# Patient Record
Sex: Male | Born: 1990 | Race: White | Hispanic: No | Marital: Single | Smoking: Never smoker
Health system: Southern US, Community
[De-identification: ages and names within clinical notes are randomized; demographics above are authoritative.]

## PROBLEM LIST (undated history)

## (undated) ENCOUNTER — Emergency Department (HOSPITAL_COMMUNITY): Discharge status: Absent without leave | Payer: 59

## (undated) DIAGNOSIS — L709 Acne, unspecified: Secondary | ICD-10-CM

## (undated) HISTORY — PX: TONSILLECTOMY AND ADENOIDECTOMY: SUR1326

## (undated) HISTORY — DX: Acne, unspecified: L70.9

---
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
## 2009-06-04 ENCOUNTER — Ambulatory Visit: Payer: Self-pay | Admitting: Family Medicine

## 2009-06-04 DIAGNOSIS — L708 Other acne: Secondary | ICD-10-CM | POA: Insufficient documentation

## 2009-07-08 ENCOUNTER — Ambulatory Visit: Payer: Self-pay | Admitting: Family Medicine

## 2010-02-24 ENCOUNTER — Encounter (INDEPENDENT_AMBULATORY_CARE_PROVIDER_SITE_OTHER): Payer: Self-pay | Admitting: *Deleted

## 2010-08-24 NOTE — Letter (Signed)
Summary: Nadara Eaton letter  [place] [facility]  [address] [place], [place] [address]   Phone: [phone]  Fax: [phone]       02/24/2010 MRN: [id number]  AUBREY VOONG [address] [place], [place]  [address]  Dear Mr. HART,  [facility] [place], and [facility] announce the retirement of Arta Silence, M.D., from full-time practice at the [facility] office effective January 21, 2010 and his plans of returning part-time.  It is important to Dr. Hetty Ely and to our practice that you understand that [facility] has seven physicians in our office for your health care needs.  We will continue to offer the same exceptional care that you have today.    Dr. Hetty Ely has spoken to many of you about his plans for retirement and returning part-time in the fall.   We will continue to work with you through the transition to schedule appointments for you in the office and meet the high standards that [facility] is committed to.   Again, it is with great pleasure that we share the news that Dr. Hetty Ely will return to [facility] at [facility] in October of 2011 with a reduced schedule.    If you have any questions, or would like to request an appointment with one of our physicians, please call us at [phone] and press the option for Scheduling an appointment.  We take pleasure in providing you with excellent patient care and look forward to seeing you at your next office visit.  Our [facility] Physicians are:  Tillman Abide, M.D. Laurita Quint, M.D. Roxy Manns, M.D. Kerby Nora, M.D. Hannah Beat, M.D. Ruthe Mannan, M.D. We proudly welcomed Raechel Ache, M.D. and Eustaquio Boyden, M.D. to the practice in July/August 2011.  Sincerely,  [facility]

## 2011-02-10 ENCOUNTER — Encounter: Payer: Self-pay | Admitting: Family Medicine

## 2011-02-11 ENCOUNTER — Encounter: Payer: Self-pay | Admitting: Family Medicine

## 2011-02-11 ENCOUNTER — Ambulatory Visit (INDEPENDENT_AMBULATORY_CARE_PROVIDER_SITE_OTHER): Payer: 59 | Admitting: Family Medicine

## 2011-02-11 VITALS — BP 120/82 | HR 68 | Temp 98.5°F | Wt 174.0 lb

## 2011-02-11 DIAGNOSIS — L708 Other acne: Secondary | ICD-10-CM

## 2011-02-11 DIAGNOSIS — Z0000 Encounter for general adult medical examination without abnormal findings: Secondary | ICD-10-CM | POA: Insufficient documentation

## 2011-02-11 MED ORDER — CLINDAMYCIN PHOSPHATE 1 % EX GEL: CUTANEOUS | Status: DC

## 2011-02-11 NOTE — Patient Instructions (Signed)
Call your insurance and check on getting the meningitis, TDaP (tetanus), and hepatitis B vaccines.  The hep B is a series of 3 shots.   I would get a flu shot each fall.   Keep exercising.  Take care.   Good luck with the police academy.

## 2011-02-13 NOTE — Assessment & Plan Note (Signed)
Restart topicals and f/u prn.

## 2011-02-13 NOTE — Assessment & Plan Note (Signed)
Routine vaccines d/w pt.  He'll check on coverage with insurance.  Healthy habits d/w pt.  Healthy appearing, okay to proceed with [employer] fitness test- no items by hx/PE that would prevent patient from participating.

## 2011-02-13 NOTE — Progress Notes (Signed)
CPE- See plan.  Routine anticipatory guidance given to patient.  See health maintenance.  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  No complaints.  Feeling well.   ROS:  See HPI.  Otherwise negative.  No personal or family history of any disorder that would prevent athletic participation.  PMH and SH reviewed  Meds, vitals, and allergies reviewed.   ROS: See HPI.  Otherwise negative.    GEN: nad, alert and oriented HEENT: mucous membranes moist NECK: supple w/o LA CV: rrr. PULM: ctab, no inc wob ABD: soft, +bs EXT: no edema SKIN: no acute rash but acne noted on face

## 2011-02-28 ENCOUNTER — Ambulatory Visit: Payer: 59 | Admitting: Family Medicine

## 2011-10-10 ENCOUNTER — Encounter: Payer: Self-pay | Admitting: Family Medicine

## 2011-10-10 ENCOUNTER — Ambulatory Visit (INDEPENDENT_AMBULATORY_CARE_PROVIDER_SITE_OTHER): Payer: 59 | Admitting: Family Medicine

## 2011-10-10 VITALS — BP 104/60 | HR 64 | Temp 98.3°F | Wt 189.0 lb

## 2011-10-10 DIAGNOSIS — J069 Acute upper respiratory infection, unspecified: Secondary | ICD-10-CM

## 2011-10-10 DIAGNOSIS — J029 Acute pharyngitis, unspecified: Secondary | ICD-10-CM

## 2011-10-10 LAB — POCT RAPID STREP A (OFFICE): Rapid Strep A Screen: NEGATIVE

## 2011-10-10 NOTE — Progress Notes (Signed)
Duration of symptoms: 3-4 days Rhinorrhea: yes congestion:yes ear pain:no sore throat: yes Cough: mild, minimal sputum Myalgias: no other concerns: + fever, chills/sweats at night +sick contacts.   Pain in frontal area.  ROS: See HPI.  Otherwise negative.    Meds, vitals, and allergies reviewed.   GEN: nad, alert and oriented HEENT: mucous membranes moist, TM w/o erythema, nasal epithelium injected, OP with cobblestoning, sinuses not ttp x4 NECK: supple w/o LA CV: rrr. PULM: ctab, no inc wob ABD: soft, +bs EXT: no edema  RST neg

## 2011-10-10 NOTE — Patient Instructions (Signed)
Drink plenty of fluids, take tylenol as needed, and gargle with warm salt water for your throat.  This should gradually improve.  Take care.  Let us know if you have other concerns.    

## 2011-10-11 DIAGNOSIS — J069 Acute upper respiratory infection, unspecified: Secondary | ICD-10-CM | POA: Insufficient documentation

## 2011-10-11 NOTE — Assessment & Plan Note (Signed)
Likely viral, nontoxic, supportive tx and f/u prn. Low chance of benefit with abx.  D/w pt.

## 2011-11-30 ENCOUNTER — Encounter: Payer: Self-pay | Admitting: Family Medicine

## 2011-11-30 ENCOUNTER — Ambulatory Visit (INDEPENDENT_AMBULATORY_CARE_PROVIDER_SITE_OTHER)
Admission: RE | Admit: 2011-11-30 | Discharge: 2011-11-30 | Disposition: A | Discharge status: Home / Self Care | Payer: 59 | Source: Ambulatory Visit | Attending: Family Medicine | Admitting: Family Medicine

## 2011-11-30 ENCOUNTER — Ambulatory Visit (INDEPENDENT_AMBULATORY_CARE_PROVIDER_SITE_OTHER): Payer: 59 | Admitting: Family Medicine

## 2011-11-30 VITALS — BP 120/72 | HR 69 | Temp 98.5°F | Ht 70.0 in | Wt 186.1 lb

## 2011-11-30 DIAGNOSIS — M25569 Pain in unspecified knee: Secondary | ICD-10-CM

## 2011-11-30 DIAGNOSIS — M25561 Pain in right knee: Secondary | ICD-10-CM

## 2011-11-30 LAB — CBC WITH DIFFERENTIAL/PLATELET
Basophils Absolute: 0 10*3/uL (ref 0.0–0.1)
Eosinophils Absolute: 0.1 10*3/uL (ref 0.0–0.7)
Lymphocytes Relative: 33.2 % (ref 12.0–46.0)
MCHC: 34.3 g/dL (ref 30.0–36.0)
Neutrophils Relative %: 55.1 % (ref 43.0–77.0)
Platelets: 198 10*3/uL (ref 150.0–400.0)
RBC: 4.81 Mil/uL (ref 4.22–5.81)
RDW: 13.1 % (ref 11.5–14.6)

## 2011-11-30 MED ORDER — DICLOFENAC SODIUM 75 MG PO TBEC: 75.0000 mg | DELAYED_RELEASE_TABLET | Freq: Two times a day (BID) | ORAL | Status: AC

## 2011-11-30 NOTE — Patient Instructions (Signed)
Recheck Dr. Patsy Lager 3-4 weeks

## 2011-11-30 NOTE — Progress Notes (Signed)
  Patient Name: Barry Heath Date of Birth: Oct 21, 1990 Age: 21 y.o. Medical Record Number: [id number] Gender: male Date of Encounter: 11/30/2011  History of Present Illness:  Barry Heath is a 22 y.o. very pleasant male patient who presents with the following:  R knee, monday nothing recently. Mostly lateraly and posterior  Relatively acute onset of knee pain and limping starting on Monday without any instigating event. No trauma or injury. 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 He also just started doing the insanity workout, but he just started that last night per his report.  No symptomatic giving way. No locking up. He does have a significant restriction in his flexion right now   Past Medical History, Surgical History, Social History, Family History, Problem List, Medications, and Allergies have been reviewed and updated if relevant.  Review of Systems:  GEN: No fevers, chills. Nontoxic. Primarily MSK c/o today. MSK: Detailed in the HPI GI: tolerating PO intake without difficulty Neuro: No numbness, parasthesias, or tingling associated. Otherwise the pertinent positives of the ROS are noted above.    Physical Examination: Filed Vitals:   11/30/11 1141  BP: 120/72  Pulse: 69  Temp: 98.5 F (36.9 C)  TempSrc: Oral  Height: 5\' 10"  (1.778 m)  Weight: 186 lb 1.9 oz (84.423 kg)  SpO2: 97%    Body mass index is 26.71 kg/(m^2).   GEN: WDWN, NAD, Non-toxic, Alert & Oriented x 3 HEENT: Atraumatic, Normocephalic.  Ears and Nose: No external deformity. EXTR: No clubbing/cyanosis/edema NEURO: Normal gait.  PSYCH: Normally interactive. Conversant. Not depressed or anxious appearing.  Calm demeanor.   Knee:  R Gait: Normal heel toe pattern ROM: 0-115 Effusion: neg Echymosis or edema: none Patellar tendon NT Painful PLICA: neg Patellar grind: negative Medial and lateral patellar facet loading: negative medial and lateral joint lines: both  ttp Mcmurray's pain Flexion-pinch pain Varus and valgus stress: stable Lachman: neg Ant and Post drawer: neg Hip abduction, IR, ER: WNL Hip flexion str: 5/5 Hip abd: 5/5 Quad: 5/5 VMO atrophy:No Hamstring concentric and eccentric: 5/5   Assessment and Plan:  1. Right knee pain  DG Knee Complete 4 Views Right, diclofenac (VOLTAREN) 75 MG EC tablet, Uric acid, CBC with Differential   X-rays, right knee. 4 view, series. Indication pain Findings: No evidence of acute fracture, dislocation, no loose body.  Right knee pain, unclear source. For now, we discussed this, and are going to do some conservative measures, limit his workouts he could be had a rating meds, and use of high-dose and anti-inflammatories for the next few weeks.  Am also getting a check labs as above which are possible for instigating this.  Recheck in 3-4 weeks  Orders Today: Orders Placed This Encounter  Procedures  . DG Knee Complete 4 Views Right    Standing Status: Future     Number of Occurrences: 1     Standing Expiration Date: 01/29/2013    Order Specific Question:  Reason for exam:    Answer:  R knee pain    Order Specific Question:  Preferred imaging location?    Answer:  [facility]  . Uric acid  . CBC with Differential    Medications Today: Meds ordered this encounter  Medications  . diclofenac (VOLTAREN) 75 MG EC tablet    Sig: Take 1 tablet (75 mg total) by mouth 2 (two) times daily.    Dispense:  60 tablet    Refill:  3

## 2011-12-01 ENCOUNTER — Encounter: Payer: Self-pay | Admitting: *Deleted

## 2011-12-28 ENCOUNTER — Encounter: Payer: Self-pay | Admitting: Family Medicine

## 2011-12-28 ENCOUNTER — Ambulatory Visit (INDEPENDENT_AMBULATORY_CARE_PROVIDER_SITE_OTHER): Payer: 59 | Admitting: Family Medicine

## 2011-12-28 VITALS — Ht 70.0 in

## 2011-12-28 DIAGNOSIS — Z0289 Encounter for other administrative examinations: Secondary | ICD-10-CM

## 2011-12-28 DIAGNOSIS — M25569 Pain in unspecified knee: Secondary | ICD-10-CM

## 2011-12-29 NOTE — Progress Notes (Signed)
Did not keep appt

## 2012-08-02 IMAGING — CR DG KNEE COMPLETE 4+V*R*
5 series · 5 of 5 positions shown · non-contrast
Comparison: None.

CLINICAL DATA: Right knee pain

RIGHT KNEE - COMPLETE 4+ VIEW

[view not recorded (1 of 5)]
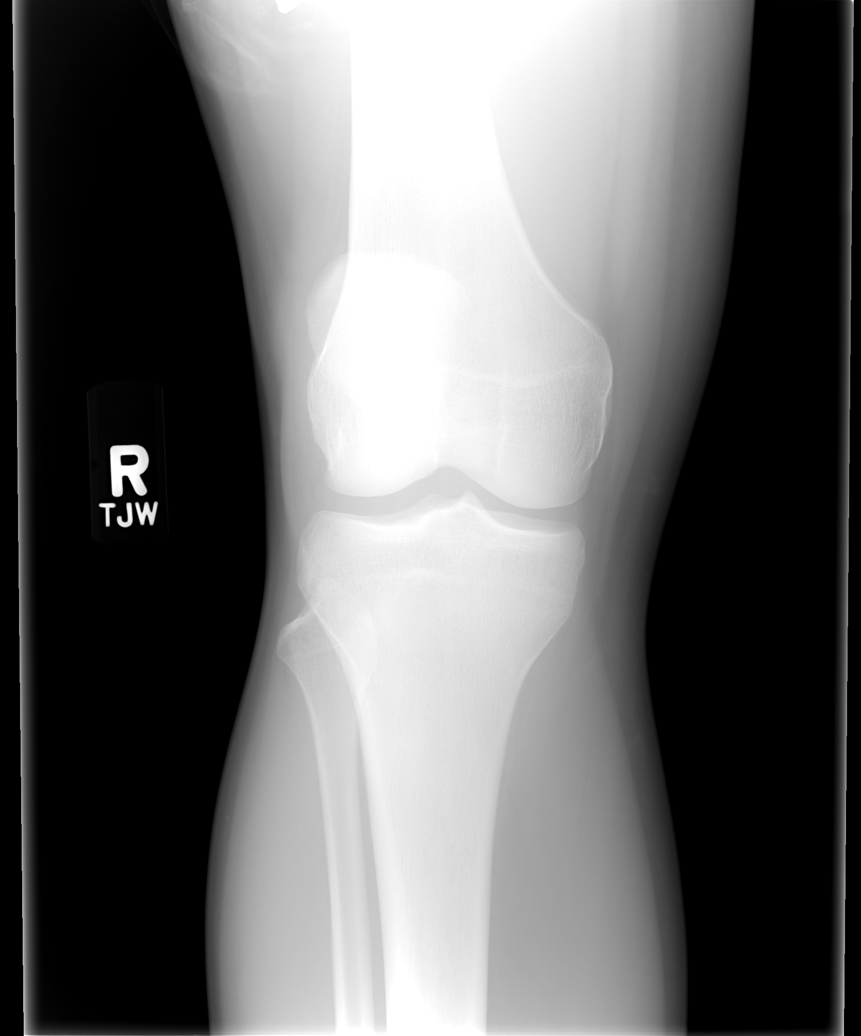

[view not recorded (2 of 5)]
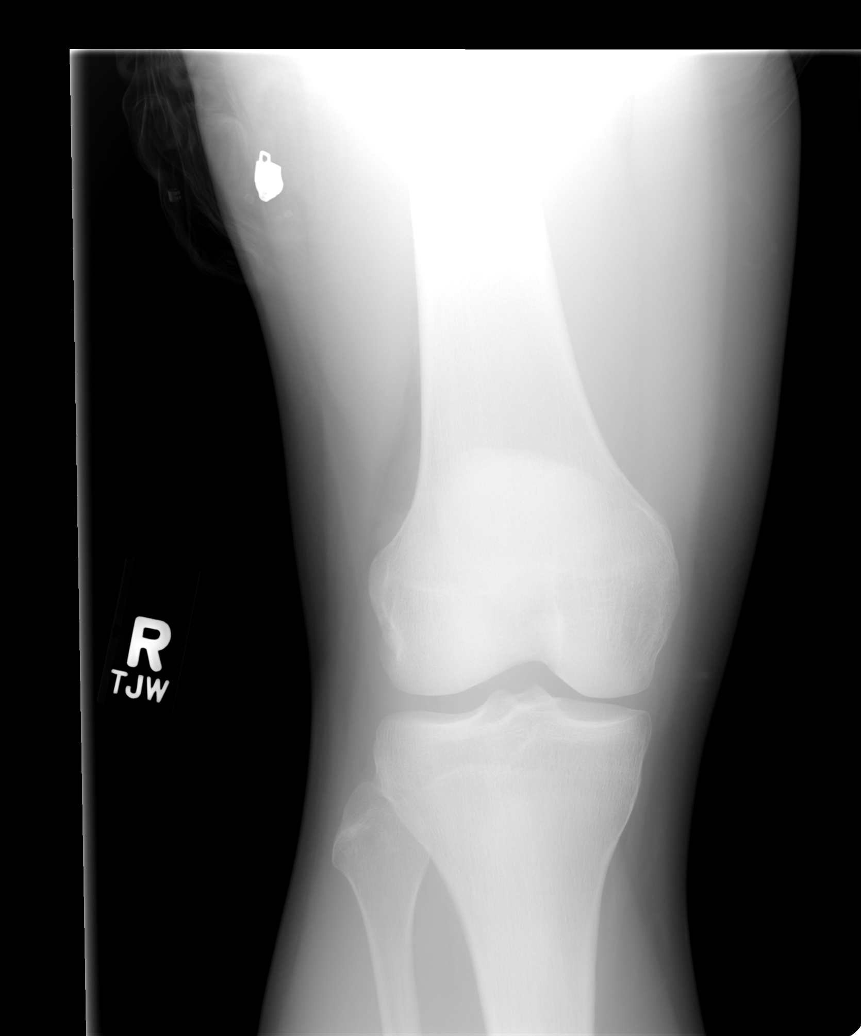

[view not recorded (3 of 5)]
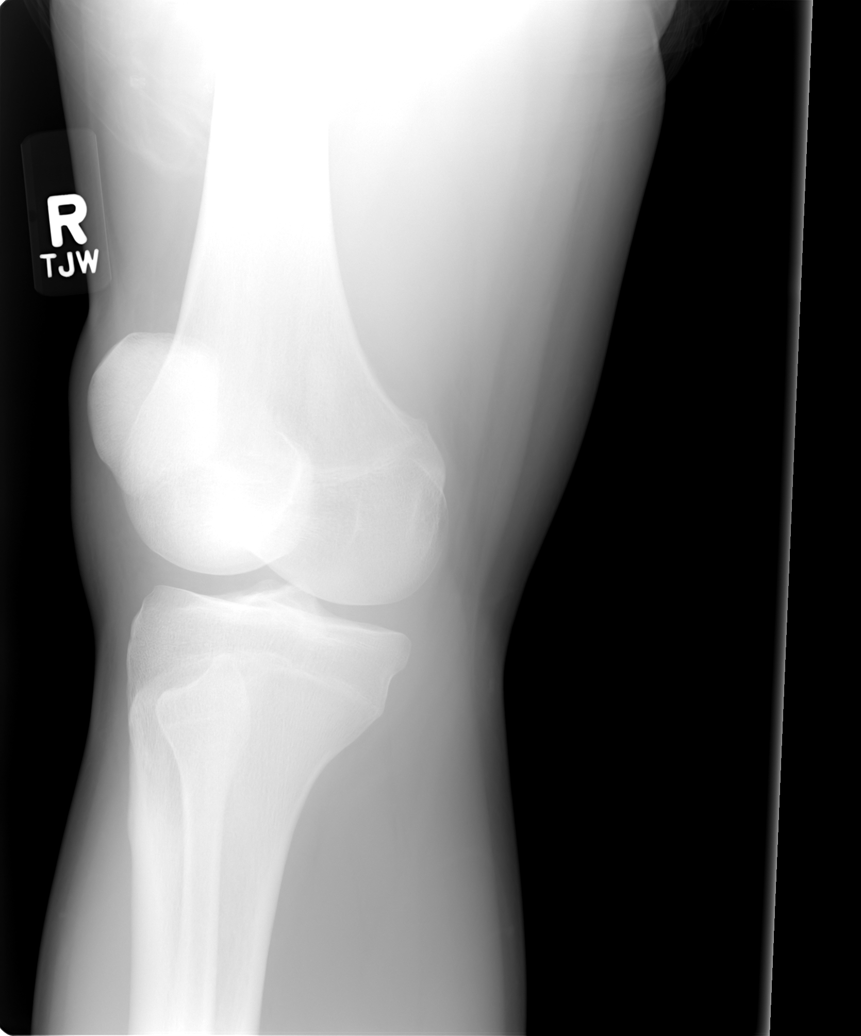

[view not recorded (4 of 5)]
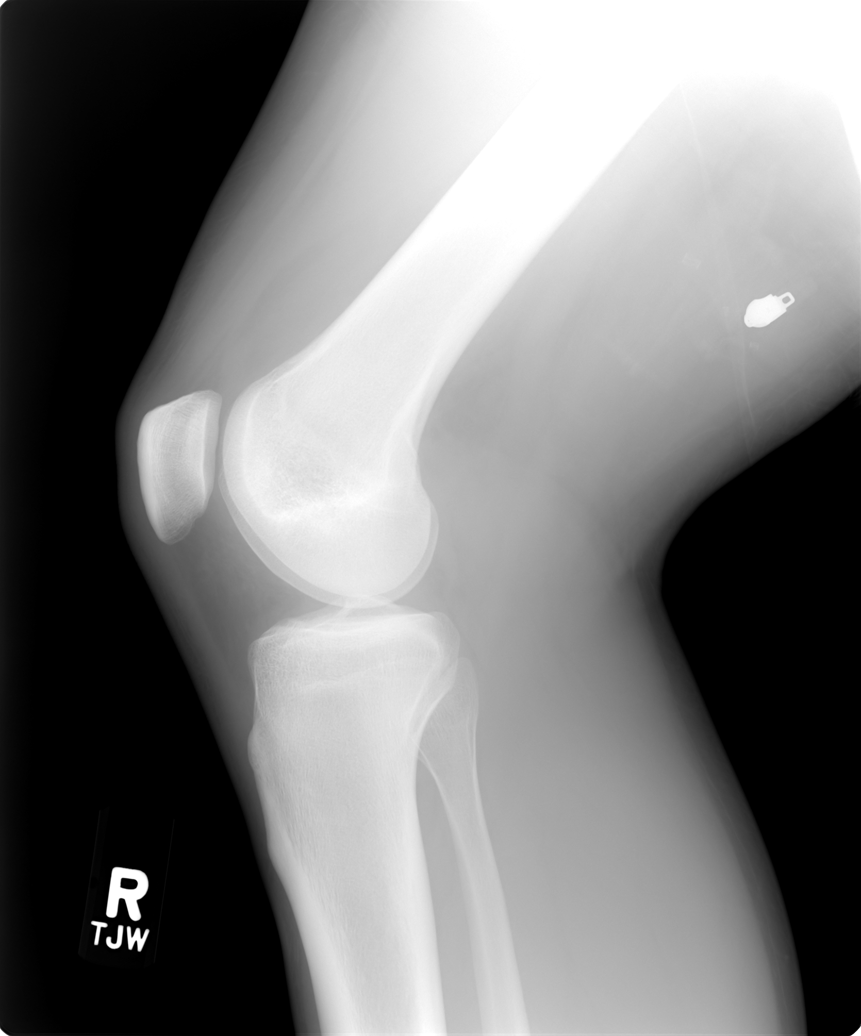

[view not recorded (5 of 5)]
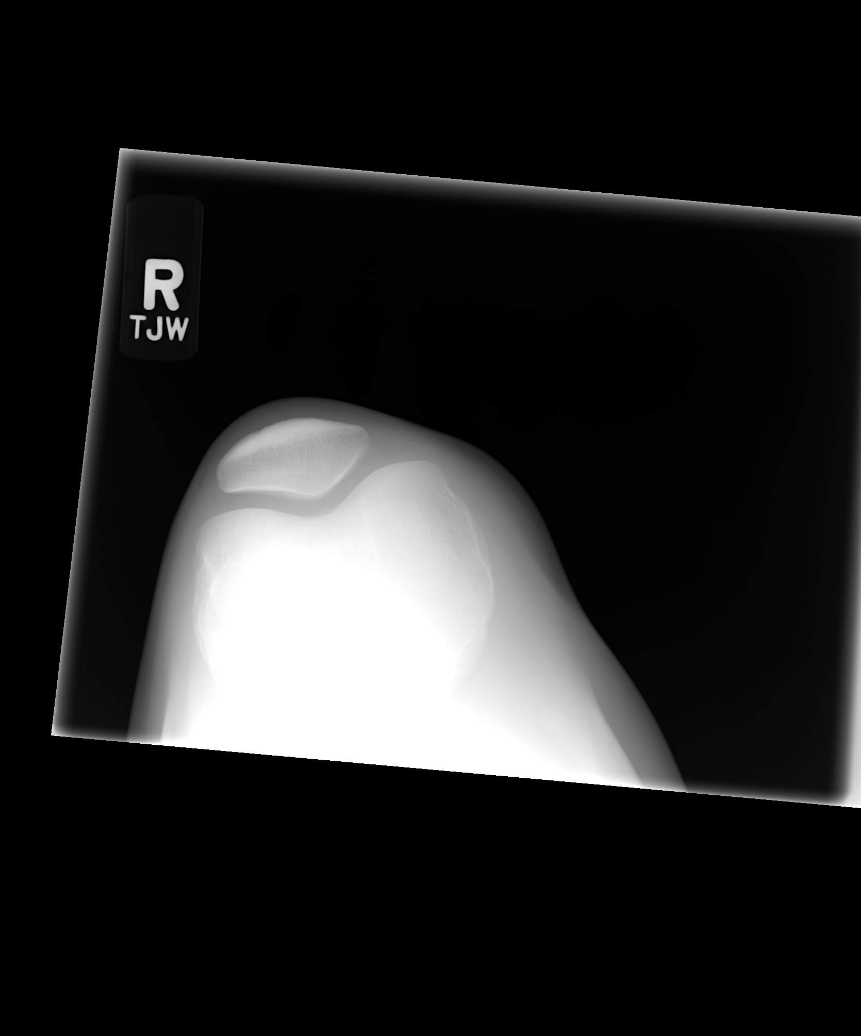

[5 of 5 positions shown; findings below may reference images not displayed]

FINDINGS: The right knee joint spaces appear normal for age.  No
fracture is seen.  There may be a small knee joint effusion
present.
IMPRESSION: No bony abnormality.  Cannot exclude a small knee joint effusion.

## 2014-10-14 ENCOUNTER — Encounter: Payer: Self-pay | Admitting: Family Medicine

## 2014-10-14 ENCOUNTER — Ambulatory Visit (INDEPENDENT_AMBULATORY_CARE_PROVIDER_SITE_OTHER): Payer: 59 | Admitting: Family Medicine

## 2014-10-14 ENCOUNTER — Other Ambulatory Visit: Payer: Self-pay | Admitting: Family Medicine

## 2014-10-14 VITALS — BP 124/78 | HR 79 | Temp 98.8°F | Wt 223.8 lb

## 2014-10-14 DIAGNOSIS — Z205 Contact with and (suspected) exposure to viral hepatitis: Secondary | ICD-10-CM | POA: Diagnosis not present

## 2014-10-14 DIAGNOSIS — Z0000 Encounter for general adult medical examination without abnormal findings: Secondary | ICD-10-CM | POA: Diagnosis not present

## 2014-10-14 NOTE — Progress Notes (Signed)
Pre visit review using our clinic review tool, if applicable. No additional management support is needed unless otherwise documented below in the visit note.  Wife RN at outside hospital.  She reportedly contracted HCV with a needlestick.  She is getting set up for treatment per patient report.  He wanted to get tested.  He is worried about her, but keeping good perspective.  D/w pt about HCV transmission.  He has no sx, no FCNAVD, no abd sx, no jaundice.    Meds, vitals, and allergies reviewed.   ROS: See HPI.  Otherwise, noncontributory.  GEN: nad, alert and oriented HEENT: mucous membranes moist NECK: supple w/o LA CV: rrr.  PULM: ctab, no inc wob ABD: soft, +bs EXT: no edema SKIN: no acute rash, no jaundice.

## 2014-10-14 NOTE — Patient Instructions (Signed)
Go to the lab on the way out.  We'll contact you with your lab report. Take care.  Glad to see you.  

## 2014-10-15 ENCOUNTER — Other Ambulatory Visit: Payer: Self-pay | Admitting: Family Medicine

## 2014-10-15 DIAGNOSIS — Z205 Contact with and (suspected) exposure to viral hepatitis: Secondary | ICD-10-CM

## 2014-10-15 LAB — HEPATITIS C ANTIBODY: HCV AB: NEGATIVE

## 2014-10-15 LAB — HIV ANTIBODY (ROUTINE TESTING W REFLEX): HIV 1&2 Ab, 4th Generation: NONREACTIVE

## 2014-10-15 NOTE — Assessment & Plan Note (Signed)
Check HIV and HCV now, see labs.  Neg labs.  Would recheck in about 6 and 12 months, to be certain but I expect him to do well.  See AVS and labs.

## 2015-10-02 ENCOUNTER — Encounter: Payer: Self-pay | Admitting: Family Medicine

## 2015-10-02 ENCOUNTER — Ambulatory Visit (INDEPENDENT_AMBULATORY_CARE_PROVIDER_SITE_OTHER): Payer: 59 | Admitting: Family Medicine

## 2015-10-02 VITALS — BP 116/62 | HR 80 | Temp 98.6°F | Wt 215.8 lb

## 2015-10-02 DIAGNOSIS — R0683 Snoring: Secondary | ICD-10-CM | POA: Diagnosis not present

## 2015-10-02 NOTE — Patient Instructions (Signed)
Shirlee LimerickMarion will call about your referral. Keep working your weight.  Glad to see you.

## 2015-10-02 NOTE — Progress Notes (Signed)
Pre visit review using our clinic review tool, if applicable. No additional management support is needed unless otherwise documented below in the visit note.  Wife had noted him snoring, worse in the last years.  H/o T&A prev.   Wife wears earplugs.   In the last 2 years, he's been more fatigue, waking tired.   More HA lately, more than prev.  Unknown if wife hears him stop breathing.  He doesn't wake up gasping for air.   Still dreams.   Not napping. He doesn't nod off.   He is working to get his weight lower.   Meds, vitals, and allergies reviewed.   ROS: See HPI.  Otherwise, noncontributory.  nad ncat OP wnl, neck supple, no LA Noted have relatively small mandible rrr ctab

## 2015-10-04 DIAGNOSIS — R0683 Snoring: Secondary | ICD-10-CM | POA: Insufficient documentation

## 2015-10-04 NOTE — Assessment & Plan Note (Signed)
Refer to pulm for considering of OSA testing.  He has suggestive hx.  I question if he'll be able to get by with an oral appliance given his jaw size, I would like pulm input on possible testing and equipment, ie CPAP vs oral appliance.  Basics of OSA d/w pt.  He understood. >15 minutes spent in face to face time with patient, >50% spent in counselling or coordination of care

## 2015-12-03 ENCOUNTER — Encounter: Payer: Self-pay | Admitting: Pulmonary Disease

## 2015-12-03 ENCOUNTER — Ambulatory Visit (INDEPENDENT_AMBULATORY_CARE_PROVIDER_SITE_OTHER): Payer: 59 | Admitting: Pulmonary Disease

## 2015-12-03 VITALS — BP 122/68 | HR 64 | Ht 70.0 in | Wt 214.0 lb

## 2015-12-03 DIAGNOSIS — G4710 Hypersomnia, unspecified: Secondary | ICD-10-CM | POA: Insufficient documentation

## 2015-12-03 DIAGNOSIS — G4733 Obstructive sleep apnea (adult) (pediatric): Secondary | ICD-10-CM

## 2015-12-03 DIAGNOSIS — R0683 Snoring: Secondary | ICD-10-CM

## 2015-12-03 NOTE — Assessment & Plan Note (Signed)
Worsening snoring through the yrs. Plan for HST. If (-), will need oral/mouth guard

## 2015-12-03 NOTE — Assessment & Plan Note (Signed)
Pt has hypersomnia, snoring.  Sleeps 6-7 hrs/night.  Wakes up several times at night.  Occasional gasping, choking.No witnessed apneas.  Does landscaping. Gets sleepy during day.  Hypersomnia affects fxnality.  (+) sleep talking. 9-) other abn behavior in sleep.   Hypersomnia has gotten worse through the years. Feels very exhausted when he wakes up in am.   ESS 12.  Plan : 1. Plan for HST. Anticipate no issues with cpap. Father and grandfather have OSA. 2. Anticipate try autocpap 5-15 cm H2O.  3. Very symptomatic > if HST is (-), need to work up for hypersomnia.

## 2015-12-03 NOTE — Progress Notes (Signed)
Subjective:    Patient ID: Barry ParodyKenneth N Biswell, male    DOB: Nov 30, 1990, 25 y.o.   MRN: [id number]  HPI   This is the case of Barry Heath, 25 y.o. Male, who was referred by Dr. Crawford GivensGraham Duncan  in consultation regarding possible OSA.   As you very well know, patient is a non smoker.  Not known to have asthma or copd.  Pt has hypersomnia, snoring.  Sleeps 6-7 hrs/night.  Wakes up several times at night.  Occasional gasping, choking.No witnessed apneas.  Does landscaping. Gets sleepy during day.  Hypersomnia affects fxnality.  (+) sleep talking. 9-) other abn behavior in sleep.   Hypersomnia has gotten worse through the years. Feels very exhausted when he wakes up in am.   ESS 12.       Review of Systems  Constitutional: Positive for unexpected weight change. Negative for fever.  HENT: Positive for sneezing. Negative for congestion, dental problem, ear pain, nosebleeds, postnasal drip, rhinorrhea, sinus pressure, sore throat and trouble swallowing.   Eyes: Positive for itching. Negative for redness.  Respiratory: Negative for cough, chest tightness, shortness of breath and wheezing.   Cardiovascular: Negative for palpitations and leg swelling.  Gastrointestinal: Negative for nausea and vomiting.  Genitourinary: Negative for dysuria.  Musculoskeletal: Negative for joint swelling.  Skin: Negative for rash.  Neurological: Negative for headaches.  Hematological: Does not bruise/bleed easily.  Psychiatric/Behavioral: Negative for dysphoric mood. The patient is not nervous/anxious.    Past Medical History  Diagnosis Date  . Acne    (-) DVT, CA  Family History  Problem Relation Age of Onset  . Hypertension Paternal Grandfather   . Stroke Paternal Grandfather   . Diabetes Neg Hx   . Heart disease Neg Hx   . Hypertension Father   . Stroke Father     h/o TIA     Past Surgical History  Procedure Laterality Date  . Tonsillectomy and adenoidectomy      25 y/o     Social History   Social History  . Marital Status: Single    Spouse Name: N/A  . Number of Children: N/A  . Years of Education: N/A   Occupational History  . Not on file.   Social History Main Topics  . Smoking status: Never Smoker   . Smokeless tobacco: Not on file  . Alcohol Use: Yes     Comment: occ  . Drug Use: No  . Sexual Activity: Not on file   Other Topics Concern  . 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 Occasional etoh.   No Known Allergies   No outpatient prescriptions prior to visit.   No facility-administered medications prior to visit.   Meds ordered this encounter  Medications  . acetaminophen (TYLENOL) 500 MG tablet    Sig: Take 500 mg by mouth as needed.          Objective:   Physical Exam  Vitals:  Filed Vitals:   12/03/15 1449  BP: 122/68  Pulse: 64  Height: 5\' 10"  (1.778 m)  Weight: 214 lb (97.07 kg)  SpO2: 99%    Constitutional/General:  Pleasant, well-nourished, well-developed, not in any distress,  Comfortably seating.  Well kempt  Body mass index is 30.71 kg/(m^2). Wt Readings from Last 3 Encounters:  12/03/15 214 lb (97.07 kg)  10/02/15 215 lb  12 oz (97.864 kg)  10/14/14 223 lb 12 oz (101.492 kg)    Neck circumference: 16.5  HEENT: Pupils equal and reactive to light and accommodation. Anicteric sclerae. Normal nasal mucosa.   No oral  lesions,  mouth clear,  oropharynx clear, no postnasal drip. (-) Oral thrush. No dental caries.  Airway - Mallampati class III  Neck: No masses. Midline trachea. No JVD, (-) LAD. (-) bruits appreciated. Short stout neck  Respiratory/Chest: Grossly normal chest. (-) deformity. (-) Accessory muscle use.  Symmetric expansion. (-) Tenderness on palpation.  Resonant on percussion.  Diminished BS on both lower lung zones. (-) wheezing, crackles, rhonchi (-) egophony  Cardiovascular:  Regular rate and  rhythm, heart sounds normal, no murmur or gallops, no peripheral edema  Gastrointestinal:  Normal bowel sounds. Soft, non-tender. No hepatosplenomegaly.  (-) masses.   Musculoskeletal:  Normal muscle tone. Normal gait.   Extremities: Grossly normal. (-) clubbing, cyanosis.  (-) edema  Skin: (-) rash,lesions seen.   Neurological/Psychiatric : alert, oriented to time, place, person. Normal mood and affect            Assessment & Plan:  Hypersomnia Pt has hypersomnia, snoring.  Sleeps 6-7 hrs/night.  Wakes up several times at night.  Occasional gasping, choking.No witnessed apneas.  Does landscaping. Gets sleepy during day.  Hypersomnia affects fxnality.  (+) sleep talking. 9-) other abn behavior in sleep.   Hypersomnia has gotten worse through the years. Feels very exhausted when he wakes up in am.   ESS 12.  Plan : 1. Plan for HST. Anticipate no issues with cpap. Father and grandfather have OSA. 2. Anticipate try autocpap 5-15 cm H2O.  3. Very symptomatic > if HST is (-), need to work up for hypersomnia.    Snoring Worsening snoring through the yrs. Plan for HST. If (-), will need oral/mouth guard        Thank you very much for letting me participate in this patient's care. Please do not hesitate to give me a call if you have any questions or concerns regarding the treatment plan.   Patient will follow up with me in 3 mos.     [name], MD 12/03/2015   6:39 PM Pulmonary and Critical Care Medicine [facility] Pager: [phone] Office: [phone], Fax: [phone]

## 2015-12-03 NOTE — Patient Instructions (Signed)
1. We will schedule you for a home sleep study. After the study is done, we will give you a call regarding results. 2. If the test is positive, we will order you an auto CPAP. Let us know if her having issues with it.  Return to clinic in 3mos.

## 2015-12-28 DIAGNOSIS — G4733 Obstructive sleep apnea (adult) (pediatric): Secondary | ICD-10-CM | POA: Diagnosis not present

## 2016-01-01 ENCOUNTER — Other Ambulatory Visit: Payer: Self-pay | Admitting: *Deleted

## 2016-01-01 DIAGNOSIS — G4733 Obstructive sleep apnea (adult) (pediatric): Secondary | ICD-10-CM

## 2016-01-05 ENCOUNTER — Telehealth: Payer: Self-pay | Admitting: Pulmonary Disease

## 2016-01-05 DIAGNOSIS — G4733 Obstructive sleep apnea (adult) (pediatric): Secondary | ICD-10-CM | POA: Diagnosis not present

## 2016-01-05 NOTE — Telephone Encounter (Signed)
Pt is requesting HST results  Please advise thanks!

## 2016-01-05 NOTE — Telephone Encounter (Signed)
LMTCB-appears patient had HST and AD advised at last OV that he would contact patient with results. I do not see any results noted or phone note created with results in them. Will need to inform patient that we will get message to AD and [name] to advise on HST.

## 2016-01-05 NOTE — Telephone Encounter (Signed)
   [name] -- pls tell pt that we ended up calling a different "Ronney LionKenneth Carbon" re: sleep study results. I apologize. HST was done on 12/28/15.    Please call the pt and tell the pt the HOME SLEEP STUDY  showed OSA.   Pt stops breathing 11   times an hour.   Please order autoCPAP 5-15 cm H2O. Patient will need a mask fitting session. Patient will need a 1 month download.   Patient needs to be seen by me or any of the NPs/APPs  4-6 weeks after obtaining the cpap machine. Let me know if you receive this.   Thanks!   J. Alexis FrockAngelo A de Dios, MD 01/05/2016, 5:35 PM

## 2016-01-06 ENCOUNTER — Other Ambulatory Visit (INDEPENDENT_AMBULATORY_CARE_PROVIDER_SITE_OTHER): Payer: 59

## 2016-01-06 ENCOUNTER — Encounter: Payer: Self-pay | Admitting: Pulmonary Disease

## 2016-01-06 ENCOUNTER — Ambulatory Visit (INDEPENDENT_AMBULATORY_CARE_PROVIDER_SITE_OTHER): Payer: 59 | Admitting: Pulmonary Disease

## 2016-01-06 VITALS — BP 128/88 | HR 83 | Ht 70.0 in | Wt 210.0 lb

## 2016-01-06 DIAGNOSIS — G4710 Hypersomnia, unspecified: Secondary | ICD-10-CM | POA: Diagnosis not present

## 2016-01-06 DIAGNOSIS — R5383 Other fatigue: Secondary | ICD-10-CM

## 2016-01-06 LAB — CBC WITH DIFFERENTIAL/PLATELET
BASOS ABS: 0 10*3/uL (ref 0.0–0.1)
Basophils Relative: 0.4 % (ref 0.0–3.0)
Eosinophils Absolute: 0.3 10*3/uL (ref 0.0–0.7)
Eosinophils Relative: 3 % (ref 0.0–5.0)
HCT: 45.4 % (ref 39.0–52.0)
Hemoglobin: 15.7 g/dL (ref 13.0–17.0)
LYMPHS ABS: 2.5 10*3/uL (ref 0.7–4.0)
Lymphocytes Relative: 27.8 % (ref 12.0–46.0)
MCHC: 34.7 g/dL (ref 30.0–36.0)
MCV: 88.9 fl (ref 78.0–100.0)
MONO ABS: 0.6 10*3/uL (ref 0.1–1.0)
Monocytes Relative: 6.3 % (ref 3.0–12.0)
NEUTROS ABS: 5.7 10*3/uL (ref 1.4–7.7)
NEUTROS PCT: 62.5 % (ref 43.0–77.0)
PLATELETS: 274 10*3/uL (ref 150.0–400.0)
RBC: 5.11 Mil/uL (ref 4.22–5.81)
RDW: 12.5 % (ref 11.5–15.5)
WBC: 9.1 10*3/uL (ref 4.0–10.5)

## 2016-01-06 LAB — COMPREHENSIVE METABOLIC PANEL
ALK PHOS: 59 U/L (ref 39–117)
ALT: 20 U/L (ref 0–53)
AST: 21 U/L (ref 0–37)
Albumin: 4.9 g/dL (ref 3.5–5.2)
BUN: 14 mg/dL (ref 6–23)
CO2: 29 meq/L (ref 19–32)
Calcium: 9.7 mg/dL (ref 8.4–10.5)
Chloride: 101 mEq/L (ref 96–112)
Creatinine, Ser: 1.09 mg/dL (ref 0.40–1.50)
GFR: 87.09 mL/min (ref >60.00)
GLUCOSE: 91 mg/dL (ref 70–99)
POTASSIUM: 4.1 meq/L (ref 3.5–5.1)
SODIUM: 138 meq/L (ref 135–145)
TOTAL PROTEIN: 7.5 g/dL (ref 6.0–8.3)
Total Bilirubin: 0.6 mg/dL (ref 0.2–1.2)

## 2016-01-06 NOTE — Patient Instructions (Signed)
   We will schedule you for a sleep study and a nap test called MSLT.  We will get blood work and urine test today.   Return to clinic in after sleep study.

## 2016-01-06 NOTE — Progress Notes (Signed)
pls see note above.

## 2016-01-06 NOTE — Progress Notes (Signed)
Subjective:    Patient ID: Barry Heath, male    DOB: [birth year], 25 y.o.   MRN: [id number]  HPI Pt returns to office as follow-up on his hypersomnia. I saw him in 11/2015 for worsening hypersomnia.   Pt has hypersomnia, snoring.  Sleeps 6-7 hrs/night.  Wakes up several times at night.  Occasional gasping, choking.No witnessed apneas.  Does landscaping. Gets sleepy during day.  Hypersomnia affects fxnality.  (+) sleep talking. (-) other abn behavior in sleep.  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   Hypersomnia has gotten worse through the years. Feels very exhausted when he wakes up in am.   HST (12/2015) AHI 2.   Generally healthy. Only takes Tylenol for pain. Occasional alcohol. Denies drug use. Denies smoking.  Review of Systems  Constitutional: Negative.   HENT: Negative.   Eyes: Negative.   Respiratory: Negative.   Cardiovascular: Negative.   Gastrointestinal: Negative.   Endocrine: Negative.   Genitourinary: Negative.   Musculoskeletal: Negative.   Skin: Negative.   Neurological: Negative.   Hematological: Negative.   Psychiatric/Behavioral: Negative.   All other systems reviewed and are negative.      Objective:   Physical Exam  Vitals:  Filed Vitals:   01/06/16 1644  BP: 128/88  Pulse: 83  Height:  (1.778 m)  Weight: 210 lb (95.255 kg)  SpO2: 97%    Constitutional/General:  Pleasant, well-nourished, well-developed, not in any distress,  Comfortably seating.  Well kempt  Body mass index is 30.13 kg/(m^2). Wt Readings from Last 3 Encounters:  01/06/16 210 lb (95.255 kg)  12/03/15 214 lb (97.07 kg)  10/02/15 215 lb 12 oz (97.864 kg)       HEENT: Pupils equal and reactive to light and accommodation. Anicteric sclerae. Normal nasal mucosa.   No oral  lesions,  mouth clear,  oropharynx clear, no postnasal drip. (-) Oral thrush. No dental caries.  Stout neck.  Airway - Mallampati class III  Neck: No masses. Midline trachea.  No JVD, (-) LAD. (-) bruits appreciated.  Respiratory/Chest: Grossly normal chest. (-) deformity. (-) Accessory muscle use.  Symmetric expansion. (-) Tenderness on palpation.  Resonant on percussion.  Diminished BS on both lower lung zones. (-) wheezing, crackles, rhonchi (-) egophony  Cardiovascular: Regular rate and  rhythm, heart sounds normal, no murmur or gallops, no peripheral edema  Gastrointestinal:  Normal bowel sounds. Soft, non-tender. No hepatosplenomegaly.  (-) masses.   Musculoskeletal:  Normal muscle tone. Normal gait.   Extremities: Grossly normal. (-) clubbing, cyanosis.  (-) edema  Skin: (-) rash,lesions seen.   Neurological/Psychiatric : alert, oriented to time, place, person. Normal mood and affect            Assessment & Plan:  Hypersomnia Pt has hypersomnia, snoring.  Sleeps 6-7 hrs/night.  Wakes up several times at night.  Occasional gasping, choking.No witnessed apneas.  Does landscaping. Gets sleepy during day.  Hypersomnia affects fxnality.  (+) sleep talking. (-) other abn behavior in sleep.  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   Hypersomnia has gotten worse through the years. Feels very exhausted when he wakes up in am.   ESS 12.   HST (12/2015)  AHI 2.   I extensively discussed with the patient results of the sleep study. He is sleepy and exhausted when he wakes up. Hypersomnia affects functionality. He does a lot of driving with his work.  Main consideration is still sleep apnea. Need to rule out idiopathic hypersomnia. Doubt narcolepsy. Need to make  sure there is no systemic disease causing hypersomnia.  Plan: 1. We will get CBC, comprehensive metabolic profile, drug screen in the urine, alcohol level, HIV test. Thyroid test. I anticipate these will be normal. Patient denies using drugs and occasionally drinks alcohol. He just came from work before seeing him this afternoon. 2. Plan for sleep study followed by MSLT. Earliest we  can do is July 30. I'm not sure if he can wait that far out because of his symptoms. Need to discuss with him regarding schedule. 3. Advised not to drive when sleepy. Advised not to engage in activities requiring concentration when sleepy. 4. Advised adequate amount of sleep at least 7 hours per night. 5. Advised on good sleep hygiene.     Fatigue Fatigue with hypersomnia. Need workup. Denies depression or anxiety. Denies drug use. Denies out: Abuse. Check blood work, urine drug screen, thyroid levels.  I spent at least 25 minutes with the patient today and more than 50% was spent counseling the patient regarding disease and management and facilitating labs and medications.      Pollie MeyerJ. Angelo A de Dios, MD 01/06/2016, 5:14 PM [facility] Pager [phone] After 3 pm or if no answer, call [phone]

## 2016-01-06 NOTE — Telephone Encounter (Signed)
Pt coming in today for ROV.

## 2016-01-06 NOTE — Assessment & Plan Note (Signed)
Fatigue with hypersomnia. Need workup. Denies depression or anxiety. Denies drug use. Denies out: Abuse. Check blood work, urine drug screen, thyroid levels.

## 2016-01-06 NOTE — Assessment & Plan Note (Signed)
Pt has hypersomnia, snoring.  Sleeps 6-7 hrs/night.  Wakes up several times at night.  Occasional gasping, choking.No witnessed apneas.  Does landscaping. Gets sleepy during day.  Hypersomnia affects fxnality.  (+) sleep talking. (-) other abn behavior in sleep.  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   Hypersomnia has gotten worse through the years. Feels very exhausted when he wakes up in am.   ESS 12.   HST (12/2015)  AHI 2.   I extensively discussed with the patient results of the sleep study. He is sleepy and exhausted when he wakes up. Hypersomnia affects functionality. He does a lot of driving with his work.  Main consideration is still sleep apnea. Need to rule out idiopathic hypersomnia. Doubt narcolepsy. Need to make sure there is no systemic disease causing hypersomnia.  Plan: 1. We will get CBC, comprehensive metabolic profile, drug screen in the urine, alcohol level, HIV test. Thyroid test. I anticipate these will be normal. Patient denies using drugs and occasionally drinks alcohol. He just came from work before seeing him this afternoon. 2. Plan for sleep study followed by MSLT. Earliest we can do is July 30. I'm not sure if he can wait that far out because of his symptoms. Need to discuss with him regarding schedule. 3. Advised not to drive when sleepy. Advised not to engage in activities requiring concentration when sleepy. 4. Advised adequate amount of sleep at least 7 hours per night. 5. Advised on good sleep hygiene.

## 2016-01-06 NOTE — Telephone Encounter (Signed)
[name] :   Pls ignore the note written on 01/05/16. That HST results are for a different Barry LionKenneth Saunders. (born in 1971).    Please call the correct Barry Heath (born 1992) and tell the pt the HOME SLEEP STUDY  did not show OSA.   Pt stops breathing  2  times an hour.  We need to investigate pt's hypersomnia.  Will need cbc,cmp, urine drug screen, blood alcohol level, tsh/free T4, HIV test.   If lab work is U/R, will need to schedule pt a sleep study followed by an MSLT.    Pollie MeyerJ. Angelo A de Dios, MD 01/06/2016, 9:03 AM

## 2016-01-07 ENCOUNTER — Ambulatory Visit: Payer: 59 | Admitting: Pulmonary Disease

## 2016-01-07 LAB — PAIN MGMT, PROFILE 1 W/O CONF, U

## 2016-01-07 LAB — HIV ANTIBODY (ROUTINE TESTING W REFLEX): HIV: NONREACTIVE

## 2016-01-08 LAB — THYROID PANEL WITH TSH
FREE THYROXINE INDEX: 2.6 (ref 1.4–3.8)
T3 UPTAKE: 34 % (ref 22–35)
T4 TOTAL: 7.5 ug/dL (ref 4.5–12.0)
TSH: 3.19 mIU/L (ref 0.40–4.50)

## 2016-01-12 LAB — PAIN MGMT, PROFILE 6 CONF W/O MM, U
6 Acetylmorphine: NEGATIVE ng/mL (ref <10)
ALCOHOL METABOLITES: NEGATIVE ng/mL (ref <500)
AMPHETAMINES: NEGATIVE ng/mL (ref <500)
BARBITURATES: NEGATIVE ng/mL (ref <300)
Benzodiazepines: NEGATIVE ng/mL (ref <100)
COCAINE METABOLITE: NEGATIVE ng/mL (ref <150)
Creatinine: 211.5 mg/dL (ref >=20.0)
METHADONE METABOLITE: NEGATIVE ng/mL (ref <100)
Marijuana Metabolite: NEGATIVE ng/mL (ref <20)
OPIATES: NEGATIVE ng/mL (ref <100)
OXIDANT: NEGATIVE ug/mL (ref <200)
OXYCODONE: NEGATIVE ng/mL (ref <100)
PH: 6.63 (ref 4.5–9.0)
PLEASE NOTE: 0
Phencyclidine: NEGATIVE ng/mL (ref <25)

## 2016-02-22 ENCOUNTER — Ambulatory Visit (HOSPITAL_BASED_OUTPATIENT_CLINIC_OR_DEPARTMENT_OTHER): Payer: 59

## 2016-02-23 ENCOUNTER — Ambulatory Visit (HOSPITAL_BASED_OUTPATIENT_CLINIC_OR_DEPARTMENT_OTHER): Payer: 59

## 2016-02-23 ENCOUNTER — Encounter (HOSPITAL_BASED_OUTPATIENT_CLINIC_OR_DEPARTMENT_OTHER): Payer: 59

## 2016-03-07 ENCOUNTER — Ambulatory Visit: Payer: 59 | Admitting: Pulmonary Disease

## 2016-09-06 DIAGNOSIS — B850 Pediculosis due to Pediculus humanus capitis: Secondary | ICD-10-CM | POA: Diagnosis not present

## 2016-09-06 DIAGNOSIS — M542 Cervicalgia: Secondary | ICD-10-CM | POA: Diagnosis not present

## 2017-05-15 DIAGNOSIS — M9903 Segmental and somatic dysfunction of lumbar region: Secondary | ICD-10-CM | POA: Diagnosis not present

## 2017-05-15 DIAGNOSIS — S39012A Strain of muscle, fascia and tendon of lower back, initial encounter: Secondary | ICD-10-CM | POA: Diagnosis not present

## 2017-05-15 DIAGNOSIS — M9905 Segmental and somatic dysfunction of pelvic region: Secondary | ICD-10-CM | POA: Diagnosis not present

## 2017-05-17 DIAGNOSIS — M9905 Segmental and somatic dysfunction of pelvic region: Secondary | ICD-10-CM | POA: Diagnosis not present

## 2017-05-17 DIAGNOSIS — M9903 Segmental and somatic dysfunction of lumbar region: Secondary | ICD-10-CM | POA: Diagnosis not present

## 2017-05-17 DIAGNOSIS — S39012A Strain of muscle, fascia and tendon of lower back, initial encounter: Secondary | ICD-10-CM | POA: Diagnosis not present

## 2017-05-22 DIAGNOSIS — M9903 Segmental and somatic dysfunction of lumbar region: Secondary | ICD-10-CM | POA: Diagnosis not present

## 2017-05-22 DIAGNOSIS — S39012A Strain of muscle, fascia and tendon of lower back, initial encounter: Secondary | ICD-10-CM | POA: Diagnosis not present

## 2017-05-22 DIAGNOSIS — M9905 Segmental and somatic dysfunction of pelvic region: Secondary | ICD-10-CM | POA: Diagnosis not present

## 2017-05-24 DIAGNOSIS — S39012A Strain of muscle, fascia and tendon of lower back, initial encounter: Secondary | ICD-10-CM | POA: Diagnosis not present

## 2017-05-24 DIAGNOSIS — M9905 Segmental and somatic dysfunction of pelvic region: Secondary | ICD-10-CM | POA: Diagnosis not present

## 2017-05-24 DIAGNOSIS — M9903 Segmental and somatic dysfunction of lumbar region: Secondary | ICD-10-CM | POA: Diagnosis not present

## 2017-05-29 DIAGNOSIS — M9905 Segmental and somatic dysfunction of pelvic region: Secondary | ICD-10-CM | POA: Diagnosis not present

## 2017-05-29 DIAGNOSIS — S39012A Strain of muscle, fascia and tendon of lower back, initial encounter: Secondary | ICD-10-CM | POA: Diagnosis not present

## 2017-05-29 DIAGNOSIS — M9903 Segmental and somatic dysfunction of lumbar region: Secondary | ICD-10-CM | POA: Diagnosis not present

## 2017-05-31 DIAGNOSIS — M9905 Segmental and somatic dysfunction of pelvic region: Secondary | ICD-10-CM | POA: Diagnosis not present

## 2017-05-31 DIAGNOSIS — M9903 Segmental and somatic dysfunction of lumbar region: Secondary | ICD-10-CM | POA: Diagnosis not present

## 2017-05-31 DIAGNOSIS — S39012A Strain of muscle, fascia and tendon of lower back, initial encounter: Secondary | ICD-10-CM | POA: Diagnosis not present

## 2017-06-02 DIAGNOSIS — S39012A Strain of muscle, fascia and tendon of lower back, initial encounter: Secondary | ICD-10-CM | POA: Diagnosis not present

## 2017-06-02 DIAGNOSIS — M9905 Segmental and somatic dysfunction of pelvic region: Secondary | ICD-10-CM | POA: Diagnosis not present

## 2017-06-02 DIAGNOSIS — M9903 Segmental and somatic dysfunction of lumbar region: Secondary | ICD-10-CM | POA: Diagnosis not present

## 2017-06-05 DIAGNOSIS — S39012A Strain of muscle, fascia and tendon of lower back, initial encounter: Secondary | ICD-10-CM | POA: Diagnosis not present

## 2017-06-05 DIAGNOSIS — M9905 Segmental and somatic dysfunction of pelvic region: Secondary | ICD-10-CM | POA: Diagnosis not present

## 2017-06-05 DIAGNOSIS — M9903 Segmental and somatic dysfunction of lumbar region: Secondary | ICD-10-CM | POA: Diagnosis not present

## 2017-06-12 DIAGNOSIS — M9905 Segmental and somatic dysfunction of pelvic region: Secondary | ICD-10-CM | POA: Diagnosis not present

## 2017-06-12 DIAGNOSIS — M9903 Segmental and somatic dysfunction of lumbar region: Secondary | ICD-10-CM | POA: Diagnosis not present

## 2017-06-12 DIAGNOSIS — S39012A Strain of muscle, fascia and tendon of lower back, initial encounter: Secondary | ICD-10-CM | POA: Diagnosis not present

## 2017-06-19 DIAGNOSIS — S39012A Strain of muscle, fascia and tendon of lower back, initial encounter: Secondary | ICD-10-CM | POA: Diagnosis not present

## 2017-06-19 DIAGNOSIS — M9903 Segmental and somatic dysfunction of lumbar region: Secondary | ICD-10-CM | POA: Diagnosis not present

## 2017-06-19 DIAGNOSIS — M9905 Segmental and somatic dysfunction of pelvic region: Secondary | ICD-10-CM | POA: Diagnosis not present

## 2019-08-28 ENCOUNTER — Telehealth: Payer: Self-pay | Admitting: Family Medicine

## 2019-08-28 NOTE — Telephone Encounter (Signed)
Patient's wife called.  Patient needs to get a referral for a sleep study.  Patient hasn't been seen in almost 4 years.  She said patient gets a yearly physical at work. Can patient re-establish with Dr.Duncan?

## 2019-08-28 NOTE — Telephone Encounter (Signed)
Patient scheduled appointment on 09/10/19.

## 2019-08-28 NOTE — Telephone Encounter (Signed)
Yes.  Schedule when possible.  Thanks.

## 2019-09-10 ENCOUNTER — Encounter: Payer: Self-pay | Admitting: Family Medicine

## 2019-09-10 ENCOUNTER — Ambulatory Visit (INDEPENDENT_AMBULATORY_CARE_PROVIDER_SITE_OTHER): Payer: 59 | Admitting: Family Medicine

## 2019-09-10 ENCOUNTER — Other Ambulatory Visit: Payer: Self-pay

## 2019-09-10 VITALS — BP 112/80 | HR 75 | Temp 97.0°F | Ht 70.0 in | Wt 221.0 lb

## 2019-09-10 DIAGNOSIS — R0683 Snoring: Secondary | ICD-10-CM

## 2019-09-10 NOTE — Progress Notes (Signed)
This visit occurred during the SARS-CoV-2 public health emergency.  Safety protocols were in place, including screening questions prior to the visit, additional usage of staff PPE, and extensive cleaning of exam room while observing appropriate contact time as indicated for disinfecting solutions.  eval for OSA.  He had prev used mouthguard but couldn't tolerate that.  He is snoring a lot.  He doesn't feel stuffy but wife noted noisy breathing.  Not waking gasping for air.  Waking tired.  Some AM HA.  He has vivid dreams.  Snoring going on for a while but prev sleep study w/o clear OSA (though that was years ago).  H/o weight variation, up and down.  16 inch neck.  Meds, vitals, and allergies reviewed.   ROS: Per HPI unless specifically indicated in ROS section   nad ncat Neck supple, no LA rrr ctab Ext w/o edema He has better clearance in the right versus the left nostril.  Is unclear to me if he has turbinate hypertrophy versus septum deviation to the left or both.

## 2019-09-10 NOTE — Patient Instructions (Signed)
We'll call about the referral to ENT.  I would start there.  I'll await those notes.  If you end up needing a referral to pulmonary then let me know.  Take care.  Glad to see you.

## 2019-09-11 NOTE — Assessment & Plan Note (Signed)
This could be exacerbated by his upper airway anatomy.  Discussed options.  We could either refer to ENT or send him for sleep study.  He does not seem to have significant change in symptoms with weight variation so I think it makes sense to see ENT first.  If he did have an anatomic obstruction that could be corrected so that he would not need CPAP or other intervention for snoring, then he would be open to potentially having surgery.  I appreciate ENT input.

## 2020-03-19 HISTORY — PX: SEPTOPLASTY: SUR1290

## 2020-04-13 ENCOUNTER — Encounter: Payer: Self-pay | Admitting: Family Medicine
